# Patient Record
Sex: Female | Born: 1980 | Race: White | Hispanic: No | Marital: Married
Health system: Southern US, Community
[De-identification: ages and names within clinical notes are randomized; demographics above are authoritative.]

---

## 2014-03-02 ENCOUNTER — Inpatient Hospital Stay: Payer: Self-pay | Admitting: Psychiatry

## 2014-03-02 LAB — ACETAMINOPHEN LEVEL

## 2014-03-02 LAB — URINALYSIS, COMPLETE
BACTERIA: NONE SEEN
BILIRUBIN, UR: NEGATIVE
BLOOD: NEGATIVE
GLUCOSE, UR: NEGATIVE mg/dL (ref 0–75)
Ketone: NEGATIVE
LEUKOCYTE ESTERASE: NEGATIVE
NITRITE: NEGATIVE
PH: 6 (ref 4.5–8.0)
Protein: NEGATIVE
RBC,UR: <1 /HPF (ref 0–5)
SPECIFIC GRAVITY: 1.006 (ref 1.003–1.030)
Squamous Epithelial: 1

## 2014-03-02 LAB — COMPREHENSIVE METABOLIC PANEL
Albumin: 3.7 g/dL (ref 3.4–5.0)
Alkaline Phosphatase: 67 U/L
Anion Gap: 6 — ABNORMAL LOW (ref 7–16)
BILIRUBIN TOTAL: 0.2 mg/dL (ref 0.2–1.0)
BUN: 10 mg/dL (ref 7–18)
CHLORIDE: 108 mmol/L — AB (ref 98–107)
Calcium, Total: 8.8 mg/dL (ref 8.5–10.1)
Co2: 25 mmol/L (ref 21–32)
Creatinine: 0.99 mg/dL (ref 0.60–1.30)
EGFR (Non-African Amer.): >60
Glucose: 117 mg/dL — ABNORMAL HIGH (ref 65–99)
OSMOLALITY: 278 (ref 275–301)
POTASSIUM: 3.9 mmol/L (ref 3.5–5.1)
SGOT(AST): 22 U/L (ref 15–37)
SGPT (ALT): 26 U/L (ref 12–78)
Sodium: 139 mmol/L (ref 136–145)
Total Protein: 7 g/dL (ref 6.4–8.2)

## 2014-03-02 LAB — DRUG SCREEN, URINE

## 2014-03-02 LAB — CBC
HCT: 41.2 % (ref 35.0–47.0)
HGB: 13.7 g/dL (ref 12.0–16.0)
MCH: 31 pg (ref 26.0–34.0)
MCHC: 33.2 g/dL (ref 32.0–36.0)
MCV: 93 fL (ref 80–100)
PLATELETS: 233 10*3/uL (ref 150–440)
RBC: 4.41 10*6/uL (ref 3.80–5.20)
RDW: 12.4 % (ref 11.5–14.5)
WBC: 5.5 10*3/uL (ref 3.6–11.0)

## 2014-03-02 LAB — PREGNANCY, URINE: Pregnancy Test, Urine: NEGATIVE m[IU]/mL

## 2014-03-02 LAB — ETHANOL
Ethanol %: <0.003 % (ref 0.000–0.080)
Ethanol: <3 mg/dL

## 2014-03-02 LAB — SALICYLATE LEVEL: Salicylates, Serum: 2.1 mg/dL

## 2014-03-02 LAB — PHENYTOIN LEVEL, TOTAL: Dilantin: 6.9 ug/mL — ABNORMAL LOW (ref 10.0–20.0)

## 2014-03-06 ENCOUNTER — Emergency Department: Payer: Self-pay | Admitting: Emergency Medicine

## 2014-03-06 LAB — COMPREHENSIVE METABOLIC PANEL
ALBUMIN: 4.2 g/dL (ref 3.4–5.0)
ALT: 73 U/L (ref 12–78)
Alkaline Phosphatase: 77 U/L
Anion Gap: 9 (ref 7–16)
BUN: 10 mg/dL (ref 7–18)
Bilirubin,Total: 0.5 mg/dL (ref 0.2–1.0)
CALCIUM: 9.1 mg/dL (ref 8.5–10.1)
CREATININE: 0.6 mg/dL (ref 0.60–1.30)
Chloride: 109 mmol/L — ABNORMAL HIGH (ref 98–107)
Co2: 19 mmol/L — ABNORMAL LOW (ref 21–32)
Glucose: 86 mg/dL (ref 65–99)
Osmolality: 272 (ref 275–301)
Potassium: 4.1 mmol/L (ref 3.5–5.1)
SGOT(AST): 37 U/L (ref 15–37)
SODIUM: 137 mmol/L (ref 136–145)
Total Protein: 7.5 g/dL (ref 6.4–8.2)

## 2014-03-06 LAB — DRUG SCREEN, URINE
Amphetamines, Ur Screen: NEGATIVE (ref ?–1000)
BENZODIAZEPINE, UR SCRN: POSITIVE (ref ?–200)
Barbiturates, Ur Screen: NEGATIVE (ref ?–200)
CANNABINOID 50 NG, UR ~~LOC~~: NEGATIVE (ref ?–50)
COCAINE METABOLITE, UR ~~LOC~~: NEGATIVE (ref ?–300)
MDMA (Ecstasy)Ur Screen: NEGATIVE (ref ?–500)
Methadone, Ur Screen: NEGATIVE (ref ?–300)
Opiate, Ur Screen: NEGATIVE (ref ?–300)
Phencyclidine (PCP) Ur S: NEGATIVE (ref ?–25)
Tricyclic, Ur Screen: NEGATIVE (ref ?–1000)

## 2014-03-06 LAB — SALICYLATE LEVEL: SALICYLATES, SERUM: 2 mg/dL

## 2014-03-06 LAB — URINALYSIS, COMPLETE
BILIRUBIN, UR: NEGATIVE
Blood: NEGATIVE
Glucose,UR: NEGATIVE mg/dL (ref 0–75)
KETONE: NEGATIVE
LEUKOCYTE ESTERASE: NEGATIVE
NITRITE: NEGATIVE
Ph: 6 (ref 4.5–8.0)
Protein: NEGATIVE
RBC,UR: 7 /HPF (ref 0–5)
SPECIFIC GRAVITY: 1.018 (ref 1.003–1.030)
Squamous Epithelial: 8
WBC UR: 1 /HPF (ref 0–5)

## 2014-03-06 LAB — CBC
HCT: 45.5 % (ref 35.0–47.0)
HGB: 15 g/dL (ref 12.0–16.0)
MCH: 30.8 pg (ref 26.0–34.0)
MCHC: 32.9 g/dL (ref 32.0–36.0)
MCV: 94 fL (ref 80–100)
Platelet: 253 10*3/uL (ref 150–440)
RBC: 4.86 10*6/uL (ref 3.80–5.20)
RDW: 12.3 % (ref 11.5–14.5)
WBC: 6.2 10*3/uL (ref 3.6–11.0)

## 2014-03-06 LAB — PHENYTOIN LEVEL, TOTAL: Dilantin: 8.1 ug/mL — ABNORMAL LOW (ref 10.0–20.0)

## 2014-03-06 LAB — ETHANOL: Ethanol %: <0.003 % (ref 0.000–0.080)

## 2014-03-06 LAB — ACETAMINOPHEN LEVEL: Acetaminophen: <2 ug/mL

## 2014-03-06 LAB — LIPASE, BLOOD: Lipase: 121 U/L (ref 73–393)

## 2014-03-06 LAB — PREGNANCY, URINE: PREGNANCY TEST, URINE: NEGATIVE m[IU]/mL

## 2014-04-06 ENCOUNTER — Other Ambulatory Visit: Payer: Self-pay | Admitting: Internal Medicine

## 2014-04-06 LAB — CBC WITH DIFFERENTIAL/PLATELET
Basophil #: 0.1 10*3/uL (ref 0.0–0.1)
Basophil %: 0.9 %
Eosinophil #: 0.3 10*3/uL (ref 0.0–0.7)
Eosinophil %: 3.8 %
HCT: 42.5 % (ref 35.0–47.0)
HGB: 13.9 g/dL (ref 12.0–16.0)
Lymphocyte #: 2.4 10*3/uL (ref 1.0–3.6)
Lymphocyte %: 35.3 %
MCH: 30.6 pg (ref 26.0–34.0)
MCHC: 32.7 g/dL (ref 32.0–36.0)
MCV: 93 fL (ref 80–100)
Monocyte #: 0.4 x10 3/mm (ref 0.2–0.9)
Monocyte %: 5.6 %
Neutrophil #: 3.7 10*3/uL (ref 1.4–6.5)
Neutrophil %: 54.4 %
Platelet: 224 10*3/uL (ref 150–440)
RBC: 4.55 10*6/uL (ref 3.80–5.20)
RDW: 12.7 % (ref 11.5–14.5)
WBC: 6.7 10*3/uL (ref 3.6–11.0)

## 2014-04-06 LAB — BASIC METABOLIC PANEL
Anion Gap: 10 (ref 7–16)
BUN: 9 mg/dL (ref 7–18)
Calcium, Total: 9.1 mg/dL (ref 8.5–10.1)
Chloride: 105 mmol/L (ref 98–107)
Co2: 28 mmol/L (ref 21–32)
Creatinine: 0.68 mg/dL (ref 0.60–1.30)
EGFR (African American): >60
EGFR (Non-African Amer.): >60
Glucose: 89 mg/dL (ref 65–99)
Osmolality: 283 (ref 275–301)
Potassium: 4.2 mmol/L (ref 3.5–5.1)
Sodium: 143 mmol/L (ref 136–145)

## 2014-04-06 LAB — TSH: Thyroid Stimulating Horm: 1.65 u[IU]/mL

## 2014-04-06 LAB — LIPID PANEL
Cholesterol: 197 mg/dL (ref 0–200)
HDL Cholesterol: 61 mg/dL — ABNORMAL HIGH (ref 40–60)
Ldl Cholesterol, Calc: 119 mg/dL — ABNORMAL HIGH (ref 0–100)
Triglycerides: 84 mg/dL (ref 0–200)
VLDL Cholesterol, Calc: 17 mg/dL (ref 5–40)

## 2014-04-08 ENCOUNTER — Emergency Department: Payer: Self-pay | Admitting: Emergency Medicine

## 2014-04-08 LAB — COMPREHENSIVE METABOLIC PANEL
ALK PHOS: 67 U/L
AST: 19 U/L (ref 15–37)
Albumin: 3.4 g/dL (ref 3.4–5.0)
Anion Gap: 6 — ABNORMAL LOW (ref 7–16)
BUN: 10 mg/dL (ref 7–18)
Bilirubin,Total: 0.3 mg/dL (ref 0.2–1.0)
CALCIUM: 8.2 mg/dL — AB (ref 8.5–10.1)
Chloride: 110 mmol/L — ABNORMAL HIGH (ref 98–107)
Co2: 27 mmol/L (ref 21–32)
Creatinine: 0.7 mg/dL (ref 0.60–1.30)
EGFR (African American): >60
EGFR (Non-African Amer.): >60
Glucose: 90 mg/dL (ref 65–99)
Osmolality: 284 (ref 275–301)
POTASSIUM: 3.9 mmol/L (ref 3.5–5.1)
SGPT (ALT): 37 U/L (ref 12–78)
Sodium: 143 mmol/L (ref 136–145)
Total Protein: 6.3 g/dL — ABNORMAL LOW (ref 6.4–8.2)

## 2014-04-08 LAB — DRUG SCREEN, URINE

## 2014-04-08 LAB — URINALYSIS, COMPLETE
BILIRUBIN, UR: NEGATIVE
Blood: NEGATIVE
Glucose,UR: NEGATIVE mg/dL (ref 0–75)
Ketone: NEGATIVE
Leukocyte Esterase: NEGATIVE
Nitrite: NEGATIVE
Ph: 6 (ref 4.5–8.0)
Protein: NEGATIVE
SPECIFIC GRAVITY: 1.024 (ref 1.003–1.030)
Squamous Epithelial: 2

## 2014-04-08 LAB — CBC
HCT: 38.9 % (ref 35.0–47.0)
HGB: 13 g/dL (ref 12.0–16.0)
MCH: 31.6 pg (ref 26.0–34.0)
MCHC: 33.5 g/dL (ref 32.0–36.0)
MCV: 94 fL (ref 80–100)
PLATELETS: 210 10*3/uL (ref 150–440)
RBC: 4.12 10*6/uL (ref 3.80–5.20)
RDW: 12.9 % (ref 11.5–14.5)
WBC: 6.3 10*3/uL (ref 3.6–11.0)

## 2014-04-08 LAB — ACETAMINOPHEN LEVEL: Acetaminophen: 3 ug/mL — ABNORMAL LOW

## 2014-04-08 LAB — ETHANOL
Ethanol %: <0.003 % (ref 0.000–0.080)
Ethanol: <3 mg/dL

## 2014-04-08 LAB — SALICYLATE LEVEL: Salicylates, Serum: 2.3 mg/dL

## 2014-04-08 LAB — PHENYTOIN LEVEL, TOTAL: Dilantin: 5.5 ug/mL — ABNORMAL LOW (ref 10.0–20.0)

## 2015-01-27 IMAGING — CT CT HEAD WITHOUT CONTRAST
2 series · 14 of 30 positions shown, 16 images · non-contrast
Comparison: None.

CLINICAL DATA: 33-year-old female with worsening headaches.
Ventriculoperitoneal shunt. Headaches, double vision an concern of
shunt malfunction. Recent seizure. Initial encounter.

EXAM:
CT HEAD WITHOUT CONTRAST
TECHNIQUE: Contiguous axial images were obtained from the base of the skull
through the vertex without intravenous contrast.

[Series 2: head wo · axial · 0.44mm/px · z∈[-119,-19]mm · 6 of 29 slices shown, 8 images]
[im 5/29  brain]
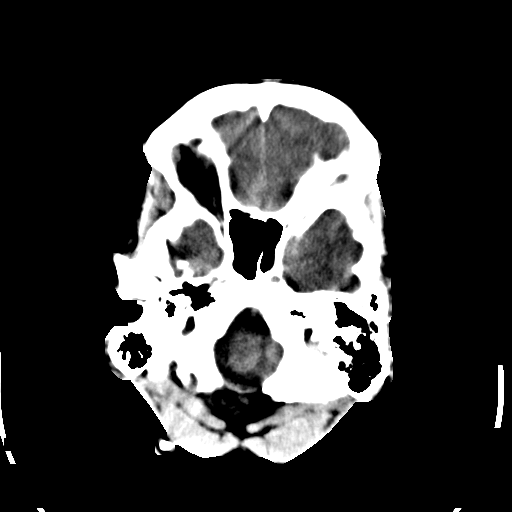
[im 5/29  bone]
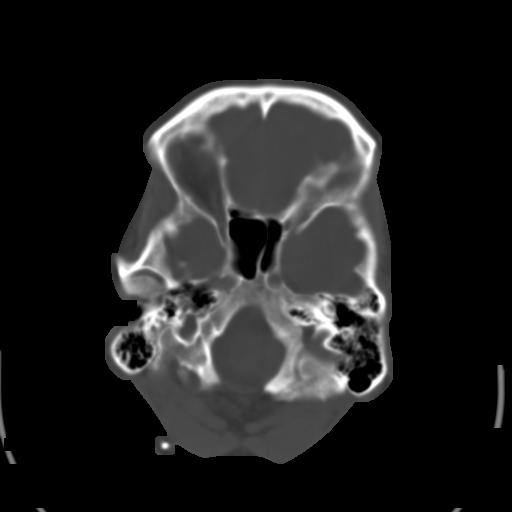
[im 9/29  brain]
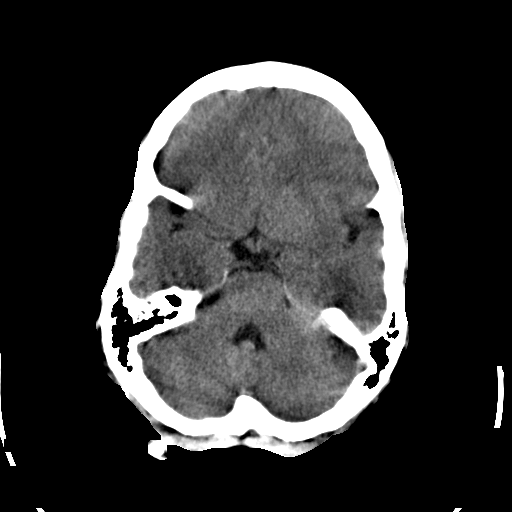
[im 13/29  brain]
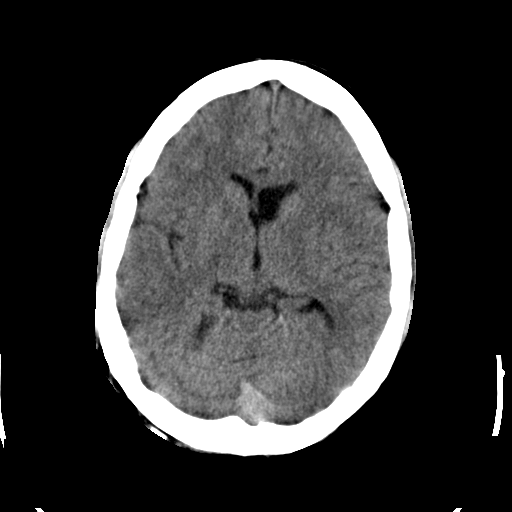
[im 17/29  brain]
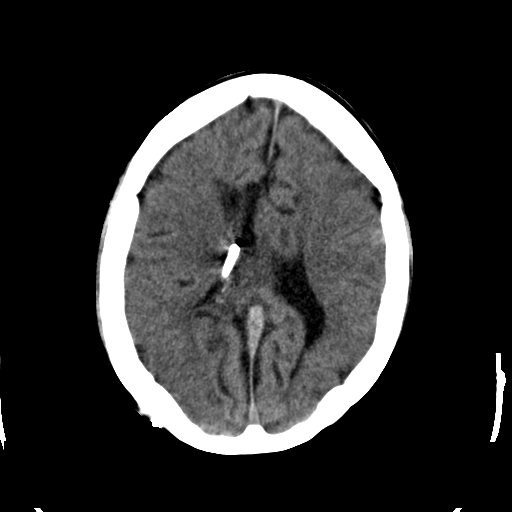
[im 21/29  brain]
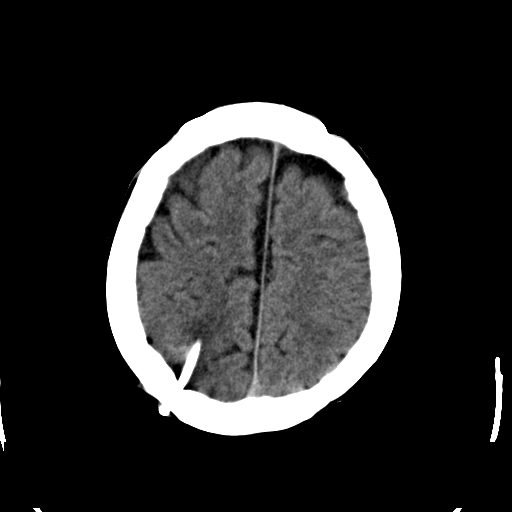
[im 21/29  bone]
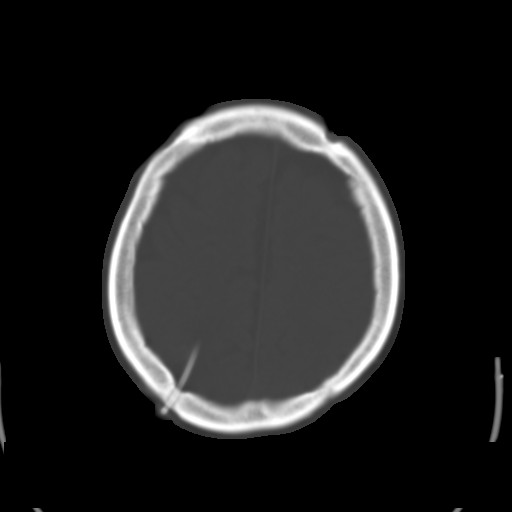
[im 25/29  brain]
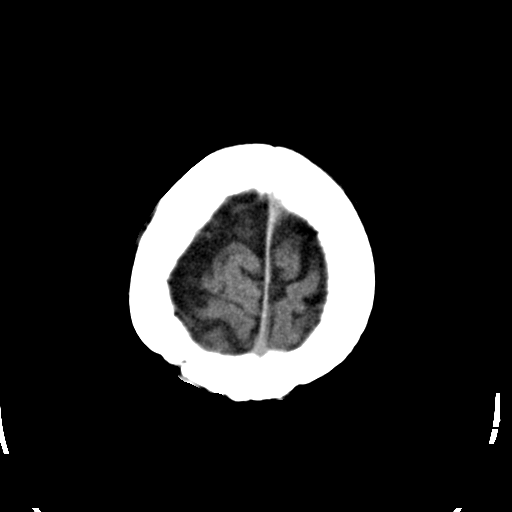

[Series 3: head bone · axial · 0.44mm/px · z∈[-135,-1]mm · 8 of 83 slices shown]
[im 8/83  bone]
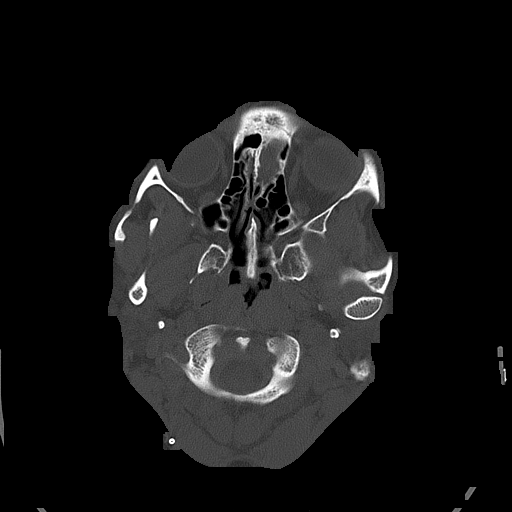
[im 16/83  bone]
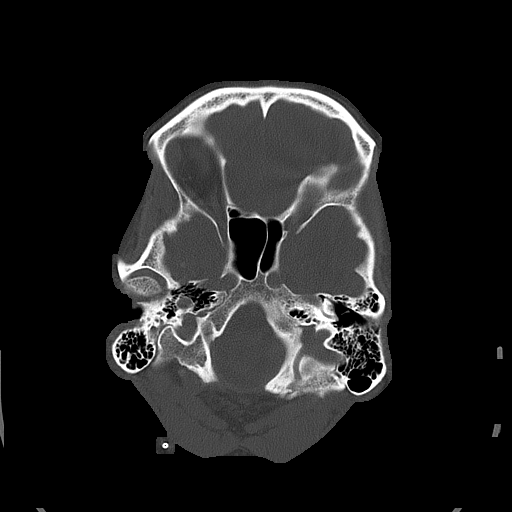
[im 28/83  bone]
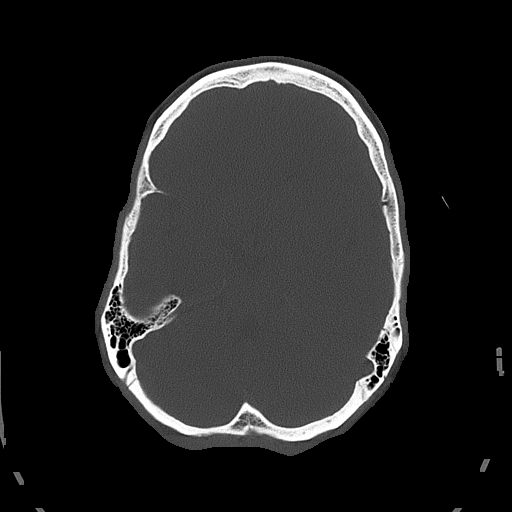
[im 36/83  bone]
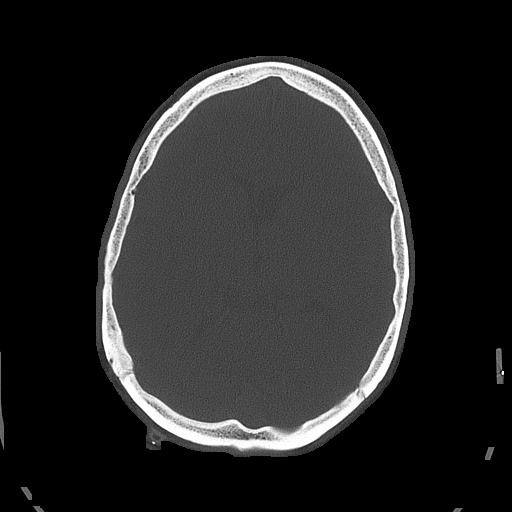
[im 47/83  bone]
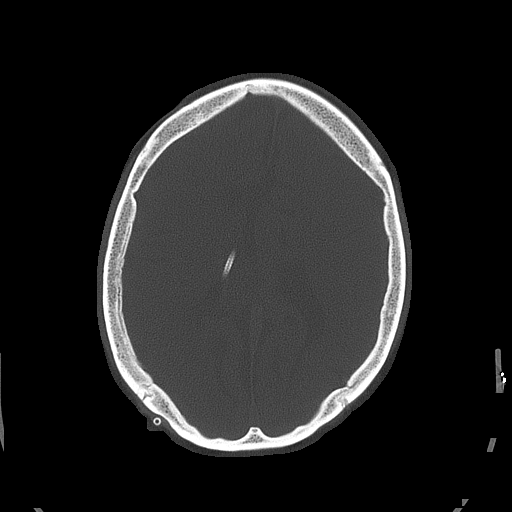
[im 55/83  bone]
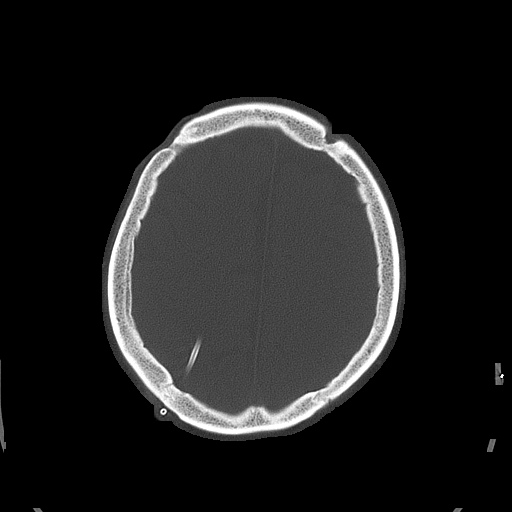
[im 67/83  bone]
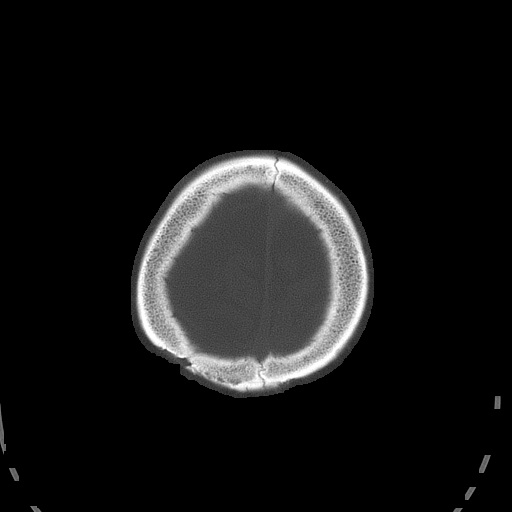
[im 75/83  bone]
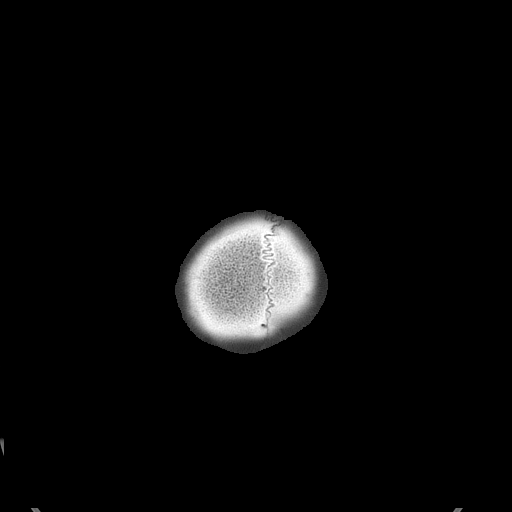

[14 of 30 positions shown; findings below may reference images not displayed]

FINDINGS: Visualized paranasal sinuses and mastoids are clear. Bone
mineralization is within normal limits. Dysconjugate gaze, otherwise
orbits soft tissues are within normal limits.

Multiple craniotomy/ burr hole sites along the frontal bones and
right superior parietal bone. There is another parietal burr hole
through which a posterior approach shunt catheter is placed. No
acute osseous abnormality identified.

Scalp soft tissues in the region of the shunt reservoir and tubing
appear within normal limits (mild scarring).

Intracranial shunt courses to or through the right lateral
ventricle, terminating near the superior aspect of the right
thalamus. Dysplastic appearance of the ventricles. The corpus
callosum is suspected to be absent or partially absent. No
ventriculomegaly; diminutive frontal horns and temporal horns.
Diminutive third ventricle. Fourth ventricle appears normal. Normal
basilar cisterns.

No midline shift. No acute intracranial hemorrhage identified. No
evidence of cortically based acute infarction identified.
IMPRESSION: 1. No prior study available for comparison, but no ventriculomegaly
or transependymal edema. No extra-axial collection or acute
intracranial abnormality is evident.
2. Mildly dysplastic ventricles, suspect dysgenesis of the corpus
callosum.
3. Posterior approach shunt and visible superficial catheter tubing
with no adverse features identified.

## 2015-01-29 IMAGING — CR DG ABDOMEN 2V
1 series · 2 of 2 positions shown · non-contrast
Comparison: None.

CLINICAL DATA: 33-year-old female with abdominal pain. Negative
pregnancy test. Initial encounter.

EXAM:
ABDOMEN - 2 VIEW

[Series 1: w abdomen upright · 0.14mm/px · 2 of 2 slices shown]
[im 1/2]
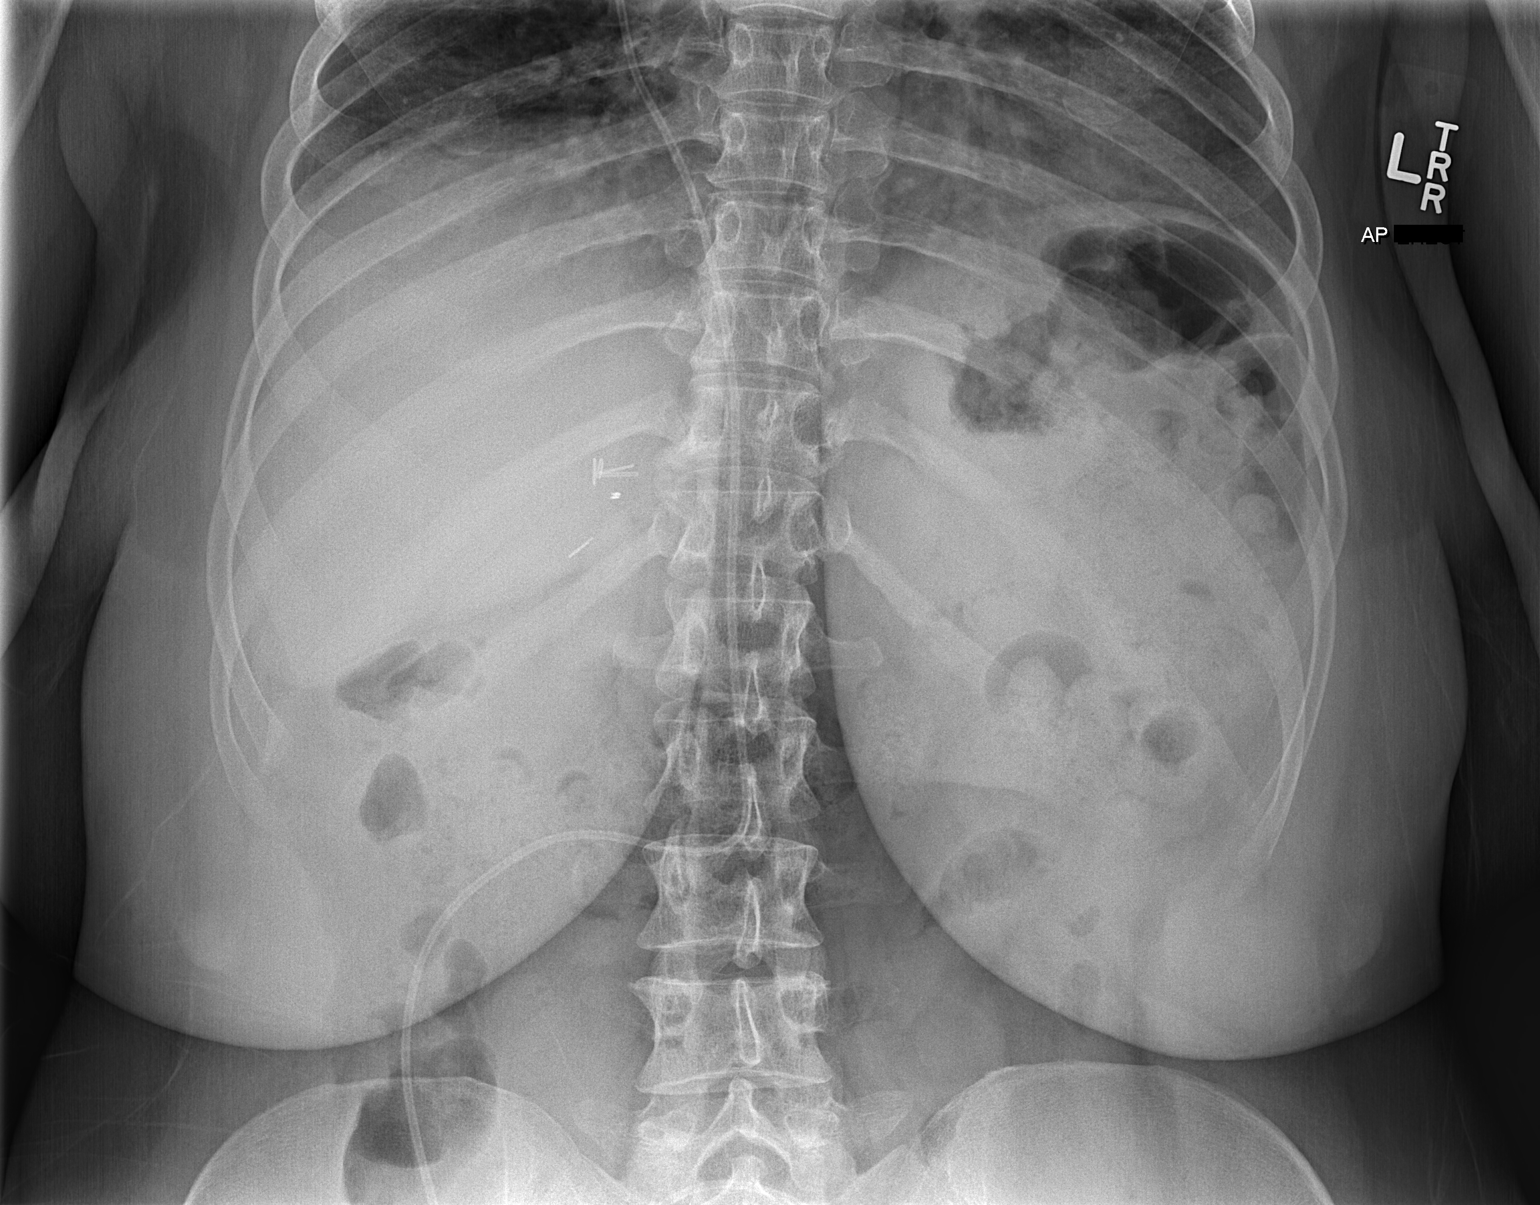
[im 2/2]
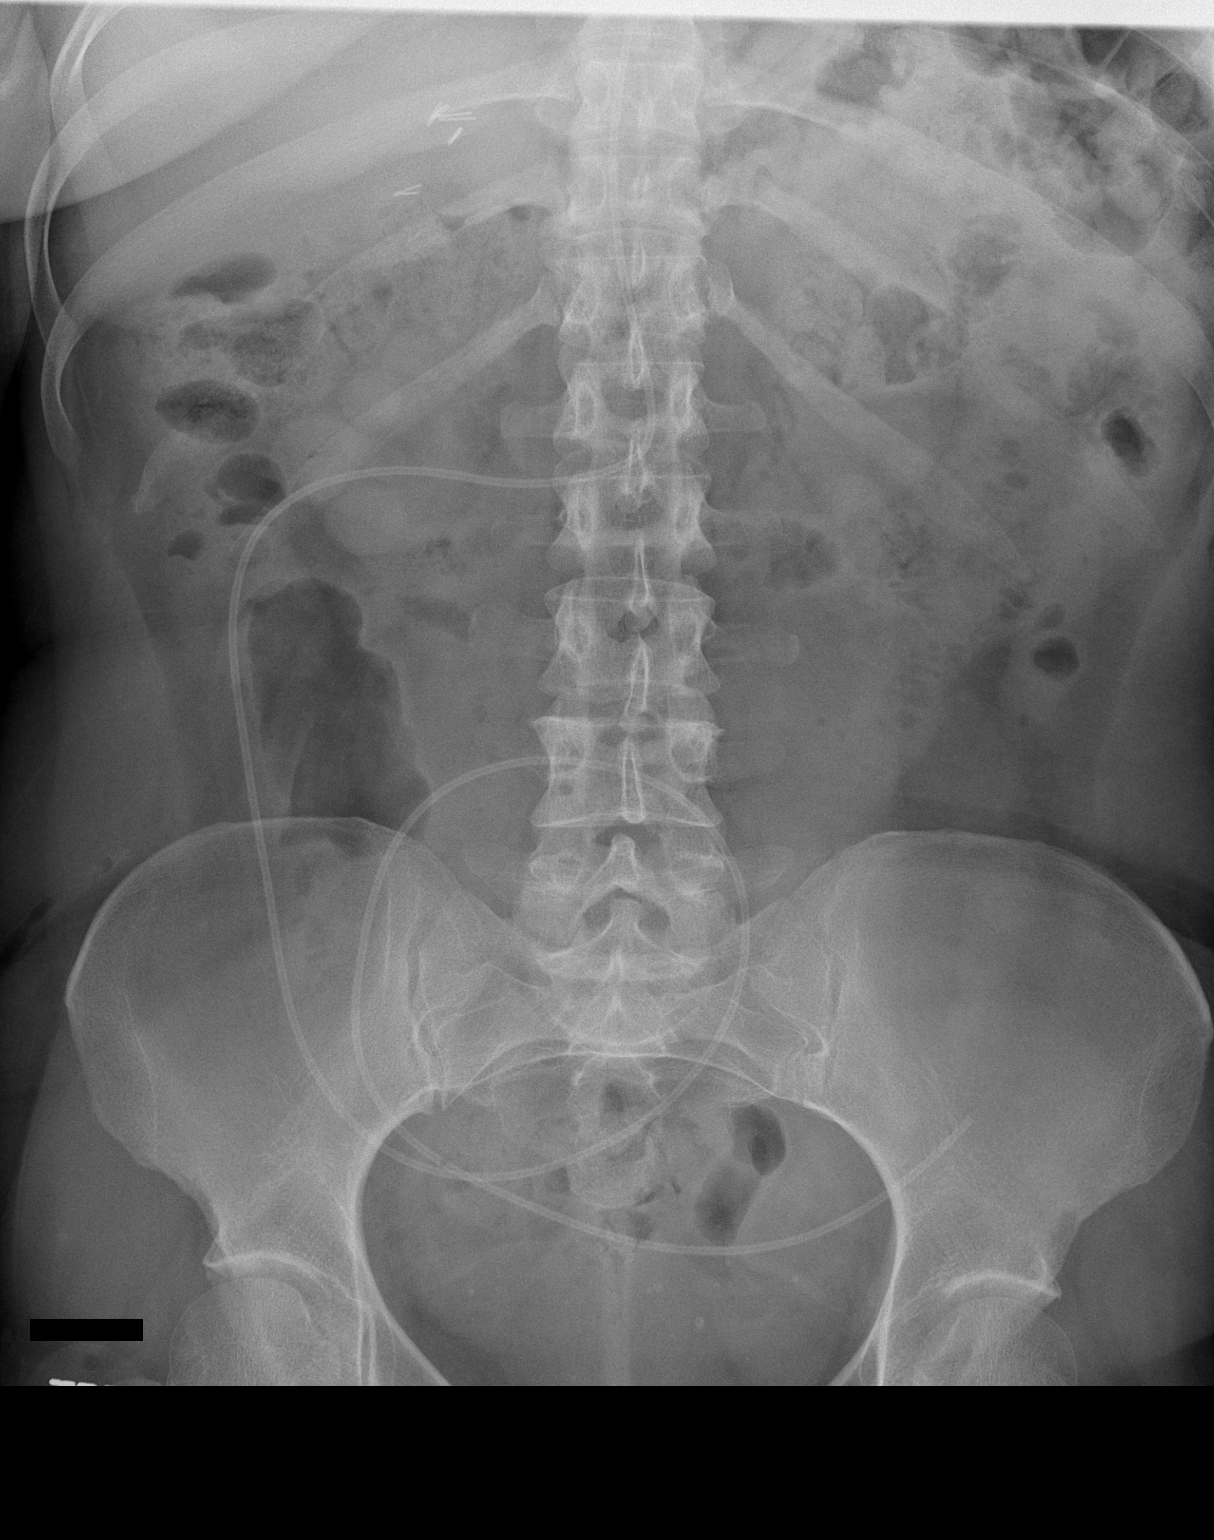

[2 of 2 positions shown; findings below may reference images not displayed]

FINDINGS: Upright and supine views. Right side probable ventriculoperitoneal
shunt catheter extends into the abdomen and is looped over the upper
pelvis. It appears intact. Right upper quadrant surgical clips.

Non obstructed bowel gas pattern. Abdominal and pelvic visceral
contours are within normal limits. Pelvic phleboliths. No acute
osseous abnormality identified.

No pneumoperitoneum, but there is left greater than right bibasilar
indistinct pulmonary opacity.
IMPRESSION: 1.  Non obstructed bowel gas pattern, no free air.
2. Abnormal lung base opacity greater on the left, suspicious for
pneumonia. Recommend followup PA and lateral chest radiographs.
3. Catheter tubing, probably VP shunt related, no adverse features.

## 2015-01-30 NOTE — H&P (Signed)
PATIENT NAME:  Melanie Key, [name] MR#:  [id number] DATE OF BIRTH:  [birth year]  DATE OF ADMISSION:  03/02/2014  DATE OF CONSULTATION: 03/03/2014  IDENTIFYING INFORMATION AND CHIEF COMPLAINT: A 34 year old woman with past psychiatric history of unknown type who was brought to the Emergency Room voluntarily by her family.   CHIEF COMPLAINT: "I've been feeling suicidal."   HISTORY OF PRESENT ILLNESS: Information obtained from the patient and the chart. The patient states that she has been having suicidal thoughts for the last week or two. They have been getting more intense. She has been thinking about overdosing on her medicine. Her mood has been more anxious and more depressed. She has been sleeping poorly at night. She feels like she is under a great deal of stress. She just recently [place] in an attempt to get away from her mother. She claims that her mother had been stealing her identity and this had caused the patient a great deal of legal problems. She denies to me that she is having daytime hallucinations, but says that she has been having nightmares. She sees dead people and has terrible dreams at night. She has been ruminating more about her young child who died in infancy about 15 years ago. This, she says, is a chronic source of distress for her. She says she has been medication compliant.   PAST PSYCHIATRIC HISTORY: I am getting conflicting stories. The chart says that she was [facility] about a year ago. The patient says that it was about a decade ago. She says she has tried to kill herself in the past by cutting her wrists and by overdosing. She has been seeing a psychiatrist in [place]. She indicated at some point that she was trying to get a new psychiatrist. It was not clear whether this was because she was dissatisfied or just because she was moving to a new area. She told me that her current medications were Cymbalta 60 mg a day, Atarax 50 mg  3 times a day, trazodone 100 mg at night. After I looked at the med reconciliation, it said that she was also getting Abilify Maintena 400 mg every month. When I ask her about that, she said that that was actually true and she was due for her shot. This suggests to me perhaps a more complicated psychiatric history than what she is at first presenting. She says she has been diagnosed with PTSD and major depression.   SOCIAL HISTORY: She gets disability. Has Medicare and Medicaid. She is not married, has no living children. She was living in [place] with her mother, but says she got away to come here to [place] to live with her sister-in-law and her boyfriend. She does not have any phone numbers for any of those people.   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 Currently, she is complaining of headaches and double vision and is concerned that the shunt is failing. She says that she recently had a seizure, which was the first seizure she had ever had and was just started on Dilantin. This is the first she appears to have been mentioning this, although it is recorded that she was on Dilantin.   SUBSTANCE ABUSE HISTORY:  Denies use of alcohol or drugs or any past abuse of alcohol or drugs.   REVIEW OF SYSTEMS: Depressed mood. Suicidal ideation. Poor sleep at night. Anxiety. Also physically complaining of a severe headache and  double vision. Denies hallucinations. No pulmonary or cardiac complaints.   CURRENT MEDICATIONS:  Cymbalta 60 mg a day, multivitamin one a day, omeprazole 20 mg a day, Dilantin 100 mg twice a day, trazodone 100 mg at night, Abilify Maintena 400 mg q. month, apparently due.   ALLERGIES: KETOROLAC, MORPHINE, ZITHROMAX AND CETACAINE.    MENTAL STATUS EXAMINATION: Disheveled woman who looks older than her stated age. Cooperative with the interview. Eye contact intermittent. Psychomotor activity a little fidgety. Speech normal rate, tone and  volume. Affect anxious and dysphoric. Mood stated as depressed. Thoughts are a little bit scattered, but she does not make any obviously bizarre statements. Denies current hallucinations. Endorses suicidal ideation without acute intent. No homicidal ideation. The patient is alert and oriented x 4. Short-term memory intact to testing. Unclear about long-term memory. Normal fund of knowledge.   PAST MEDICAL HISTORY:  No acute skin lesions. She does have old scars on her wrists. 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  PHYSICAL EXAMINATION:  Pupils equal.  Face a little lopsided. Oral mucosa dry. Neck and back: Nontender. Normal gait. Full range of motion in all extremities. Strength and reflexes normal and symmetric. As mentioned, cranial nerves are damaged with her eye being disconjugate on the left side.  LUNGS: Clear without any wheezes.  HEART: Regular rate and rhythm.  ABDOMEN: Soft, nontender, normal bowel sounds.  VITAL SIGNS: Temperature 99.3, pulse 84, respirations 20, blood pressure 115/78.   LABORATORY RESULTS: Dilantin level low at 6.9. Salicylates and acetaminophen normal or negative. Alcohol negative. Chemistry panel: Nothing remarkable. CBC normal. Pregnancy test negative. Urinalysis unremarkable. Drug screen negative.   ASSESSMENT: A 34 year old woman who presents with suicidal ideation and complaints of depression and anxiety. Somewhat odd and chaotic recent social history. Also is evidently taking a chronic antipsychotic, which might suggest a more complicated psychiatric illness than what she is describing. She also evidently has hydrocephalus with a shunt in place and possibly new onset seizures.   TREATMENT PLAN: I have continued her medication but increased the Cymbalta to 90 mg a day for depression. Add a very small dose of Prazosin at night for  nightmares. I gave her acute Motrin and Percocet for the headache. I have asked for a neurology consult both about the shunt and about the seizures. Try to involve her in groups and activities. I called the only phone number we have in the chart and the person who answered claimed to not know the patient at all. We therefore have no further leads at this point. Her outpatient psychiatrist from [facility] called me and the patient gave permission to call him back but I have just had to leave a voicemail.   DIAGNOSIS, PRINCIPAL AND PRIMARY:  AXIS I: Major depression, severe, recurrent.   SECONDARY DIAGNOSES: AXIS I:    Posttraumatic stress disorder.  AXIS II:   Deferred. AXIS III: History of hydrocephalus with intact shunt, new onset seizures, gastric reflux symptoms.  AXIS IV:  Severe from relocation and lack of support.  AXIS V:   Functioning at time of evaluation 30.   ____________________________ Audery Amel, MD jtc:ce D: 03/03/2014 18:15:15 ET T: 03/03/2014 18:43:53 ET JOB#: [id number]  cc: Audery Amel, MD, <Dictator> Audery Amel MD ELECTRONICALLY SIGNED 03/25/2014 13:19

## 2015-01-30 NOTE — Discharge Summary (Signed)
PATIENT NAME:  Melanie Key, Nilah MR#:  [id number] DATE OF BIRTH:  30-Aug-1981  DATE OF ADMISSION:  03/02/2014 DATE OF DISCHARGE:  03/05/2014  HOSPITAL COURSE: See dictated history and physical for details of admission. This 34 year old woman with a history of chronic mental illness, presented to our Emergency Room stating that she was feeling like she was having a breakdown and was having suicidal thoughts. She was admitted to the psychiatric unit. At the time we had minimal background information about her. She did have a list of medications that she had been taking. She was continued on her outpatient psychiatric medicine. In the hospital, she was treated with medication and individual and group psychotherapy. After meeting with the patient and discussing options, I proposed increasing her Duloxetine to 90 mg a day, which we have done. I have also added 2 mg of prazosin at night to help with her chronic nightmares. She has continued taking her Dilantin as it was on admission. She did not initially tell me she was getting Abilify Maintena injection, but revealed that to me and that it was due on the following day. She was given a 400 mg injection intramuscularly on the 27th, which she tolerated fine. Her outpatient providers with her ACT team in [place] got in touch with me and I spoke with her outpatient psychiatrist, Dr. Georjean ModeBryce Reynolds. He gave me very useful information about this patient's chronic illness. She has been seen by the ACT team in [place] for quite a long time. 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 They have noted some intermittent psychotic symptoms with her and find that the Abilify Maintena is useful. They find that during crises, she  often decompensates and used to go into the hospital frequently. They have worked on trying to  keep her out of the hospital recently in [place]. They had known that she was planning to move to [place] and were a bit concerned about her followup. They indicated to me that they are happy to continue following up with her especially if she returns to [place]. I spoke with the patient, who tells me that she is planning to continue to stay in [place]. She now has located her boyfriend and sister-in-law and says she has a temporary place to stay and plans to then get a trailer of her own. With her consent, I will try to notify the ACT team back in [place] of her plans. She will be referred; however, to a local mental health facility which can potentially provide her with ACT team services as well if she wants to transition. Because she was complaining of severe headaches and had a history of a shunt about which I knew nothing and because she was telling me she had new onset seizures, a CAT scan was ordered. The long and short of it is that shows the shunt to be in place where it is supposed to be and does not show any obvious signs of new pathology. I requested a neurology consult the day before yesterday. No neurologist ever came to see her while she was in the hospital. She has not shown any seizure activity. Her headaches have gone away. She does not look like someone who is having brain swelling at this time. Therefore, she will be discharged with the recommendation that she continue to stay in touch with her  neurosurgeon back in [place] and that she see a primary care doctor here and [place] for followup care.   MENTAL STATUS EXAM AT DISCHARGE: A slightly disheveled woman, casually dressed, slightly malodorous this morning, cooperative and pleasant. Good eye contact, normal psychomotor activity. Speech normal rate, tone and volume. Affect euthymic and reactive and appropriate. Mood stated as good. Thoughts are slow, but lucid. Nothing bizarre. No obvious delusions. Denies hallucinations. Denies  suicidal or homicidal ideation. Shows improved judgment and insight. Baseline intelligence, probably mildly impaired. I would agree that she gives the impression of probably having some degree of developmental disability, although she is able to read and write and communicate fluently. Short-and long term memory show some degree of impairment. Alert and oriented x 4.   DISCHARGE MEDICATIONS: Dilantin extended release 100 mg twice a day; trazodone 100 mg at night; duloxetine 90 mg per day; Abilify Maintena 400 mg intramuscular every 4 weeks with the next dose due approximately June 24; hydroxyzine 50 mg 3 times a day; multivitamin once a day, prazosin 2 mg at night and pantoprazole 40 mg per day.   LABORATORY RESULTS: Drug screen on admission negative. Chemistry panel unremarkable. Slightly elevated glucose on a non-fasting draw. Dilantin level was below the normal range at 6.9. CBC was all normal. Urinalysis was normal. Pregnancy test negative. Acetaminophen and salicylates negative.   DISPOSITION: The patient is discharged to live here in [place] and will be given a referral for outpatient care here. I will try and get in touch with her providers back in [place] to let him know the plan.   DIAGNOSIS, PRINCIPAL AND PRIMARY:  AXIS I: Bipolar disorder, not otherwise specified.  SECONDARY DIAGNOSES:  AXIS I: Posttraumatic stress disorder.  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  AXIS III: History of hydrocephalus, history of seizures, history of gastric reflux.  AXIS IV: Severe from relocation.  AXIS V: Functioning at time of discharge 50.   ____________________________ Audery Amel, MD jtc:aw D: 03/05/2014 11:22:09 ET T: 03/05/2014 11:31:23 ET JOB#: [id number]  cc: Audery Amel, MD, <Dictator> Audery Amel MD ELECTRONICALLY SIGNED 03/25/2014 13:19

## 2015-01-30 NOTE — Consult Note (Signed)
PATIENT NAME:  Melanie Key, [name] MR#:  [id number] DATE OF BIRTH:  Jul 04, 1981  DATE OF CONSULTATION:  03/02/2014  REFERRING PHYSICIAN:   CONSULTING PHYSICIAN:  Taneshia Lorence K. Annabella Elford, MD  AGE:  33 years.  SEX:  Female.  RACE:  White.  SUBJECTIVE:  Patient was seen in consultation in the ER.  Patient is a 34 year old white female not employed and divorced and currently has a fiance and lives with fiance and his sister and her fiance.  All 4 of them live in a farm house.  Patient comes to Emergency Room with a chief complaint of, "Look at my hands."  Patient reports that she had thoughts to cut herself and showed her wrists because she has done that in the past.  Patient feels stressed out about life in general and in fact relating and interacting with her mother with whom she has lots of conflicts.  In fact, she reports that she moved from [place] to local area to be away from the mother and she has been looking for a psychiatrist in this area.    PAST PSYCHIATRIC HISTORY:  History of inpatient admisssions to psychiatry in [place] for mental health.  History of suicide attempts by cutting herself and also on 1 occasion she drank Psychologist, [facility] [employer].  Was being followed by psychiatrist in [place].  Wants to change her psychiatrist and come close by.    ALCOHOL AND DRUGS: Denies drinking alcohol.  Denies history of drug abuse.  Does admit smoking nicotine cigarettes.  MENTAL STATUS:   Patient is alert and oriented.  Aware of the situation that brought her here.  Affect is flat and mood restricted.  Admits to feeling helpless and hopeless.  Admits to feeling worthless and useless.  Denies any suicidal wishes and thoughts and calls herself a cutter and wants to cut herself.  No psychosis. Denies auditory, visual hallucinations, delusional paranoid thinking.  Insight and judgement poor.   IMPRESSION: Major depressive disorder, recurrent, with suicidal ideas but contract for safety.  History of panic attacks and  posttraumatic stress disorder symptoms.  Recommend inpatient admission to psychiatry when patient medically cleared and stable and when bed is available and patient is agreeable to get help here.   ____________________________ Jannet MantisSurya K. Guss Bundehalla, MD skc:dd D: 03/02/2014 16:45:14 ET T: 03/02/2014 18:42:41 ET JOB#: [id number]  cc: Monika SalkSurya K. Guss Bundehalla, MD, <Dictator> Beau FannySURYA K Jenee Spaugh MD ELECTRONICALLY SIGNED 03/05/2014 8:08

## 2015-01-30 NOTE — Consult Note (Signed)
PATIENT NAME:  Melanie Key, [name] MR#:  [id number] DATE OF BIRTH:  [birth year]  DATE OF CONSULTATION:  04/08/2014  REFERRING PHYSICIAN:   CONSULTING PHYSICIAN:  Shenee Wignall K. Talon Witting, MD  SEX:  Female.  RACE:  White.  AGE:  33 years.  SUBJECTIVE:  The patient was seen in consultation in the ER, [facility].  Patient is a 34 year old white female who is not employed, is on disability for VP shunt for hydrocephalus.  The patient is separated for several years after being married for 5 years.  Patient has been living with her boyfriend, along with her sister-in-law and her fiance.  All 4 of them live in a house.  Patient came with her sister-in-law to the hospital and sister-in-law told one of the nurses that this patient has had suicidal visions and thoughts in the past.  [facility].  Patient does not find a reason for being here.   PAST PSYCHIATRIC HISTORY:  History of inpatient psychiatry for depression and PTSD after her child died from SIDS.  Patient [facility] inpatient [facility].  No history of suicide anthem being followed on outpatient basis for her psychiatric problems.   MENTAL STATUS:  Patient is alert and oriented to place, person, and time.  Calm, pleasant and cooperative.  No agitation.  Affect is neutral, mood stable.  Denies feeling depressed.  Denies feeling hopeless or helpless.  Admits that she is upset because of being here and all she did was come to see her sister-in-law and here she is admitted.   No psychosis.  Does not appear to be responding to internal stimuli.  Cognition is grossly intact.  Denies any ideas of plans to hurt herself or others.  Insight and judgment fair.  She is eager to go home and get out.  IMPRESSION:  Major depressive disorder at current, currently stable.  Post-traumatic stress disorder from child dying from sudden infant death syndrome and father's death-remote.    RECOMMENDATIONS:  Patient is a  voluntary patient and she can be discharged home to go back to live at her home with her boyfriend and keep up her follow up appointments.    ____________________________ Jannet MantisSurya K. Guss Bundehalla, MD skc:ts D: 04/08/2014 15:10:13 ET T: 04/08/2014 18:35:34 ET JOB#: [id number]  cc: Monika SalkSurya K. Guss Bundehalla, MD, <Dictator> Beau FannySURYA K Thoams Siefert MD ELECTRONICALLY SIGNED 04/11/2014 17:41
